# Patient Record
Sex: Female | Born: 1995 | Race: Black or African American | Hispanic: No | Marital: Single | Smoking: Never smoker
Health system: Southern US, Community
[De-identification: ages and names within clinical notes are randomized; demographics above are authoritative.]

---
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
## 2016-01-08 ENCOUNTER — Ambulatory Visit (HOSPITAL_COMMUNITY)
Admission: EM | Admit: 2016-01-08 | Discharge: 2016-01-08 | Discharge status: Absent without leave | Payer: BLUE CROSS/BLUE SHIELD

## 2019-07-04 ENCOUNTER — Other Ambulatory Visit: Payer: Self-pay

## 2019-07-04 ENCOUNTER — Encounter (HOSPITAL_COMMUNITY): Payer: Self-pay | Admitting: Emergency Medicine

## 2019-07-04 DIAGNOSIS — J4521 Mild intermittent asthma with (acute) exacerbation: Secondary | ICD-10-CM | POA: Insufficient documentation

## 2019-07-04 MED ORDER — ALBUTEROL SULFATE HFA 108 (90 BASE) MCG/ACT IN AERS
1.0000 | INHALATION_SPRAY | RESPIRATORY_TRACT | Status: DC
  Administered 2019-07-04: 2 via RESPIRATORY_TRACT
  Filled 2019-07-04: qty 6.7

## 2019-07-04 NOTE — ED Triage Notes (Signed)
Pt c/o SOB and chest tightness admit to Hx asthma and is out of her inhaler. Speaking in full sentence .

## 2019-07-05 ENCOUNTER — Emergency Department (HOSPITAL_COMMUNITY)
Admission: EM | Admit: 2019-07-05 | Discharge: 2019-07-05 | Disposition: A | Discharge status: Home / Self Care | Payer: BLUE CROSS/BLUE SHIELD | Attending: Emergency Medicine | Admitting: Emergency Medicine

## 2019-07-05 DIAGNOSIS — J4521 Mild intermittent asthma with (acute) exacerbation: Secondary | ICD-10-CM

## 2019-07-05 MED ORDER — PREDNISONE 20 MG PO TABS
60.0000 mg | ORAL_TABLET | Freq: Once | ORAL | Status: AC
  Administered 2019-07-05: 02:00:00 60 mg via ORAL
  Filled 2019-07-05: qty 3

## 2019-07-05 MED ORDER — ALBUTEROL SULFATE HFA 108 (90 BASE) MCG/ACT IN AERS: 2.0000 | INHALATION_SPRAY | Freq: Four times a day (QID) | RESPIRATORY_TRACT | 0 refills | Status: AC | PRN

## 2019-07-05 MED ORDER — PREDNISONE 20 MG PO TABS: 40.0000 mg | ORAL_TABLET | Freq: Every day | ORAL | 0 refills | Status: AC

## 2019-07-05 NOTE — ED Notes (Signed)
EDP at bedside  

## 2019-07-05 NOTE — ED Provider Notes (Signed)
[facility]-EMERGENCY DEPT Provider Note   CSN: [id number] Arrival date & time: 07/04/19  2230     History   Chief Complaint Chief Complaint  Patient presents with  . Asthma  . Shortness of Breath    HPI Kristina Frey is a 23 y.o. female.     23 year old female with a history of asthma presents to the emergency department for chest tightness and shortness of breath.  Her symptoms have been waxing and waning in severity, but gradually worsening.  She has been out of her albuterol inhaler for greater than 1 month as her primary care office is no longer open.  She has not had any provider to refill her prescription.  She was given an albuterol inhaler in triage.  Reports resolution of her chest tightness after use of this medication.  She is currently feeling asymptomatic, at her baseline.  Denies associated fevers, syncope, chest pain.  The history is provided by the patient. No language interpreter was used.  Asthma Associated symptoms include shortness of breath.  Shortness of Breath   No past medical history on file.  There are no active problems to display for this patient.   No past surgical history on file.   OB History   No obstetric history on file.      Home Medications    Prior to Admission medications   Medication Sig Start Date End Date Taking? Authorizing Provider  albuterol (VENTOLIN HFA) 108 (90 Base) MCG/ACT inhaler Inhale 2 puffs into the lungs every 6 (six) hours as needed for wheezing or shortness of breath. 07/05/19   Antony Madura, PA-C  doxycycline (VIBRAMYCIN) 100 MG capsule Take 100 mg by mouth 2 (two) times daily.    [provider]  predniSONE (DELTASONE) 20 MG tablet Take 2 tablets (40 mg total) by mouth daily. 07/05/19   Antony Madura, PA-C    Family History Family History  Problem Relation Age of Onset  . Asthma Brother     Social History Social History   Tobacco Use  . Smoking status: Never Smoker   . Smokeless tobacco: Never Used  Substance Use Topics  . Alcohol use: No  . Drug use: No     Allergies   Patient has no known allergies.   Review of Systems Review of Systems  Respiratory: Positive for shortness of breath.    Ten systems reviewed and are negative for acute change, except as noted in the HPI.    Physical Exam Updated Vital Signs BP 119/84   Pulse 77   Temp 98.1 F (36.7 C) (Oral)   Resp 16   Ht 5\' 4"  (1.626 m)   Wt 72.1 kg   SpO2 100%   BMI 27.29 kg/m   Physical Exam Vitals signs and nursing note reviewed.  Constitutional:      General: She is not in acute distress.    Appearance: She is well-developed. She is not diaphoretic.     Comments: Nontoxic appearing and in NAD  HENT:     Head: Normocephalic and atraumatic.  Eyes:     General: No scleral icterus.    Conjunctiva/sclera: Conjunctivae normal.  Neck:     Musculoskeletal: Normal range of motion.  Cardiovascular:     Rate and Rhythm: Normal rate and regular rhythm.     Pulses: Normal pulses.  Pulmonary:     Effort: Pulmonary effort is normal. No respiratory distress.     Comments: Lungs CTAB. Respirations even and unlabored. Musculoskeletal:  Normal range of motion.  Skin:    General: Skin is warm and dry.     Coloration: Skin is not pale.     Findings: No erythema or rash.  Neurological:     Mental Status: She is alert and oriented to person, place, and time.  Psychiatric:        Behavior: Behavior normal.      ED Treatments / Results  Labs (all labs ordered are listed, but only abnormal results are displayed) Labs Reviewed - No data to display  EKG EKG Interpretation  Date/Time:  Thursday July 04 2019 22:56:00 EDT Ventricular Rate:  74 PR Interval:    QRS Duration: 69 QT Interval:  341 QTC Calculation: 379 R Axis:   75 Text Interpretation: Sinus rhythm ST elev, probable normal early repol pattern Baseline wander in lead(s) II III aVF V3 V4 V5 V6 No significant  change was found Confirmed by Molpus, John [phone]) on 07/04/2019 11:00:41 PM   Radiology No results found.  Procedures Procedures (including critical care time)  Medications Ordered in ED Medications  albuterol (VENTOLIN HFA) 108 (90 Base) MCG/ACT inhaler 1-2 puff (2 puffs Inhalation Given 07/04/19 2309)  predniSONE (DELTASONE) tablet 60 mg (60 mg Oral Given 07/05/19 0207)     Initial Impression / Assessment and Plan / ED Course  I have reviewed the triage vital signs and the nursing notes.  Pertinent labs & imaging results that were available during my care of the patient were reviewed by me and considered in my medical decision making (see chart for details).        23 year old female presenting for symptoms consistent with asthma exacerbation brought on by medication noncompliance as she ran out of her inhaler more than 1 month ago.  Her vital signs are stable.  She has no hypoxia.  Her symptoms have resolved following use of an albuterol inhaler in triage.  Her lungs are clear on my assessment.  Plan for discharge on burst of prednisone.  Her albuterol inhaler prescription was refilled.  Have encouraged her to follow-up with a primary care doctor.  Return precautions discussed and provided. Patient discharged in stable condition with no unaddressed concerns.   Final Clinical Impressions(s) / ED Diagnoses   Final diagnoses:  Mild intermittent asthma with exacerbation    ED Discharge Orders         Ordered    albuterol (VENTOLIN HFA) 108 (90 Base) MCG/ACT inhaler  Every 6 hours PRN     07/05/19 0158    predniSONE (DELTASONE) 20 MG tablet  Daily     07/05/19 0158           Antonietta Breach, PA-C 07/05/19 0222    Shanon Rosser, MD 07/05/19 [phone]

## 2019-07-05 NOTE — Discharge Instructions (Signed)
Take prednisone as prescribed until finished.  Use 2 puffs of an albuterol inhaler every 4-6 hours as needed for chest tightness, wheezing, shortness of breath.  Follow-up with a primary care doctor.

## 2019-07-09 ENCOUNTER — Emergency Department (HOSPITAL_COMMUNITY): Payer: Medicaid Other

## 2019-07-09 ENCOUNTER — Other Ambulatory Visit: Payer: Self-pay

## 2019-07-09 ENCOUNTER — Emergency Department (HOSPITAL_COMMUNITY)
Admission: EM | Admit: 2019-07-09 | Discharge: 2019-07-09 | Disposition: A | Discharge status: Home / Self Care | Payer: Medicaid Other | Attending: Emergency Medicine | Admitting: Emergency Medicine

## 2019-07-09 DIAGNOSIS — M62838 Other muscle spasm: Secondary | ICD-10-CM

## 2019-07-09 DIAGNOSIS — M546 Pain in thoracic spine: Secondary | ICD-10-CM | POA: Insufficient documentation

## 2019-07-09 DIAGNOSIS — M549 Dorsalgia, unspecified: Secondary | ICD-10-CM

## 2019-07-09 DIAGNOSIS — R0789 Other chest pain: Secondary | ICD-10-CM | POA: Insufficient documentation

## 2019-07-09 DIAGNOSIS — Z79899 Other long term (current) drug therapy: Secondary | ICD-10-CM | POA: Insufficient documentation

## 2019-07-09 LAB — CBC
HCT: 38.4 % (ref 36.0–46.0)
Hemoglobin: 13.3 g/dL (ref 12.0–15.0)
MCH: 27 pg (ref 26.0–34.0)
MCHC: 34.6 g/dL (ref 30.0–36.0)
MCV: 77.9 fL — ABNORMAL LOW (ref 80.0–100.0)
Platelets: 205 10*3/uL (ref 150–400)
RBC: 4.93 MIL/uL (ref 3.87–5.11)
RDW: 13.1 % (ref 11.5–15.5)
WBC: 8.5 10*3/uL (ref 4.0–10.5)
nRBC: 0 % (ref 0.0–0.2)

## 2019-07-09 LAB — BASIC METABOLIC PANEL
Anion gap: 10 (ref 5–15)
BUN: 11 mg/dL (ref 6–20)
CO2: 22 mmol/L (ref 22–32)
Calcium: 8.6 mg/dL — ABNORMAL LOW (ref 8.9–10.3)
Chloride: 106 mmol/L (ref 98–111)
Creatinine, Ser: 0.91 mg/dL (ref 0.44–1.00)
GFR calc Af Amer: >60 mL/min (ref >60)
GFR calc non Af Amer: >60 mL/min (ref >60)
Glucose, Bld: 79 mg/dL (ref 70–99)
Potassium: 3.4 mmol/L — ABNORMAL LOW (ref 3.5–5.1)
Sodium: 138 mmol/L (ref 135–145)

## 2019-07-09 LAB — I-STAT BETA HCG BLOOD, ED (MC, WL, AP ONLY): I-stat hCG, quantitative: <5 m[IU]/mL (ref <5)

## 2019-07-09 LAB — TROPONIN I (HIGH SENSITIVITY)
Troponin I (High Sensitivity): 2 ng/L (ref <18)
Troponin I (High Sensitivity): <2 ng/L (ref <18)

## 2019-07-09 MED ORDER — LIDOCAINE 5 % EX PTCH: 1.0000 | MEDICATED_PATCH | CUTANEOUS | 0 refills | Status: AC

## 2019-07-09 MED ORDER — METHOCARBAMOL 500 MG PO TABS: 500.0000 mg | ORAL_TABLET | Freq: Two times a day (BID) | ORAL | 0 refills | Status: AC

## 2019-07-09 MED ORDER — IBUPROFEN 600 MG PO TABS: 600.0000 mg | ORAL_TABLET | Freq: Four times a day (QID) | ORAL | 0 refills | Status: AC | PRN

## 2019-07-09 MED ORDER — SODIUM CHLORIDE 0.9% FLUSH: 3.0000 mL | Freq: Once | INTRAVENOUS | Status: DC

## 2019-07-09 NOTE — ED Provider Notes (Signed)
[facility] DEPT Provider Note   CSN: [id number] Arrival date & time: 07/09/19  1451     History   Chief Complaint Chief Complaint  Patient presents with  . back pain  . Chest Pain    HPI Kristina Frey is a 23 y.o. female.     HPI   Kristina Frey is a 23 y.o. female, patient with no pertinent past medical history, presenting to the ED with pain to the left upper back, shoulder, and chest for about the past week. States it feels like a tightness and a soreness, moderate in intensity, nonradiating from these locations.  States it feels as though she is having muscle spasms intermittently.  Denies fever/chills, cough, shortness of breath, exertional chest pain, dizziness, diaphoresis, falls/trauma, numbness, weakness, or any other complaints.       No past medical history on file.  There are no active problems to display for this patient.   No past surgical history on file.   OB History   No obstetric history on file.      Home Medications    Prior to Admission medications   Medication Sig Start Date End Date Taking? Authorizing Provider  albuterol (VENTOLIN HFA) 108 (90 Base) MCG/ACT inhaler Inhale 2 puffs into the lungs every 6 (six) hours as needed for wheezing or shortness of breath. 07/05/19  Yes Antonietta Breach, PA-C  doxycycline (VIBRAMYCIN) 100 MG capsule Take 100 mg by mouth 2 (two) times daily.   Yes [provider]  predniSONE (DELTASONE) 20 MG tablet Take 2 tablets (40 mg total) by mouth daily. 07/05/19  Yes Antonietta Breach, PA-C  ibuprofen (ADVIL) 600 MG tablet Take 1 tablet (600 mg total) by mouth every 6 (six) hours as needed. 07/09/19   Keyonda Bickle C, PA-C  lidocaine (LIDODERM) 5 % Place 1 patch onto the skin daily. Remove & Discard patch within 12 hours or as directed by MD 07/09/19   Muaz Shorey C, PA-C  methocarbamol (ROBAXIN) 500 MG tablet Take 1 tablet (500 mg total) by mouth 2 (two) times daily. 07/09/19    Manolo Bosket, Helane Gunther, PA-C    Family History Family History  Problem Relation Age of Onset  . Asthma Brother     Social History Social History   Tobacco Use  . Smoking status: Never Smoker  . Smokeless tobacco: Never Used  Substance Use Topics  . Alcohol use: No  . Drug use: No     Allergies   Patient has no known allergies.   Review of Systems Review of Systems  Constitutional: Negative for chills, diaphoresis and fever.  Respiratory: Negative for cough and shortness of breath.   Cardiovascular: Negative for chest pain.  Gastrointestinal: Negative for abdominal pain, diarrhea, nausea and vomiting.  Musculoskeletal: Positive for back pain. Negative for neck pain.       Chest, trapezius, and upper back soreness  Neurological: Negative for dizziness, weakness, numbness and headaches.  All other systems reviewed and are negative.    Physical Exam Updated Vital Signs BP 123/82   Pulse 66   Temp 98.1 F (36.7 C) (Oral)   Resp 12   SpO2 100%   Physical Exam Vitals signs and nursing note reviewed.  Constitutional:      General: She is not in acute distress.    Appearance: She is well-developed. She is not diaphoretic.  HENT:     Head: Normocephalic and atraumatic.     Mouth/Throat:     Mouth:  Mucous membranes are moist.     Pharynx: Oropharynx is clear.  Eyes:     Conjunctiva/sclera: Conjunctivae normal.  Neck:     Musculoskeletal: Neck supple.     Comments: No pain with range of motion of the neck. Cardiovascular:     Rate and Rhythm: Normal rate and regular rhythm.     Pulses: Normal pulses.          Radial pulses are 2+ on the right side and 2+ on the left side.     Heart sounds: Normal heart sounds.     Comments: Tactile temperature in the extremities appropriate and equal bilaterally. Pulmonary:     Effort: Pulmonary effort is normal. No respiratory distress.     Breath sounds: Normal breath sounds.  Chest:     Chest wall: Tenderness present. No  deformity, swelling or crepitus.    Abdominal:     Palpations: Abdomen is soft.     Tenderness: There is no abdominal tenderness. There is no guarding.  Musculoskeletal:       Back:     Right lower leg: No edema.     Left lower leg: No edema.     Comments: Tenderness in the left upper back, left trapezius, and left upper chest without noted skin abnormalities, swelling, deformity, or crepitus. Full range of motion without pain or noted difficulty in the left shoulder, though she does state it "feels tight."  Normal motor function intact in all extremities. No midline spinal tenderness.   Lymphadenopathy:     Cervical: No cervical adenopathy.  Skin:    General: Skin is warm and dry.  Neurological:     Mental Status: She is alert.     Comments: Sensation grossly intact to light touch through each of the nerve distributions of the bilateral upper extremities. Abduction and adduction of the fingers intact against resistance. Grip strength equal bilaterally. Supination and pronation intact against resistance. Strength 5/5 through the cardinal directions of the bilateral wrists. Strength 5/5 with flexion and extension of the bilateral elbows. Patient can touch the thumb to each one of the fingertips without difficulty.  Patient can hold the "OK" sign against resistance.  Psychiatric:        Mood and Affect: Mood and affect normal.        Speech: Speech normal.        Behavior: Behavior normal.      ED Treatments / Results  Labs (all labs ordered are listed, but only abnormal results are displayed) Labs Reviewed  BASIC METABOLIC PANEL - Abnormal; Notable for the following components:      Result Value   Potassium 3.4 (*)    Calcium 8.6 (*)    All other components within normal limits  CBC - Abnormal; Notable for the following components:   MCV 77.9 (*)    All other components within normal limits  I-STAT BETA HCG BLOOD, ED ([facility], [facility], [facility] ONLY)  TROPONIN I (HIGH SENSITIVITY)   TROPONIN I (HIGH SENSITIVITY)    EKG None  Radiology Dg Chest 2 View  Result Date: 07/09/2019 CLINICAL DATA:  Chest pain. EXAM: CHEST - 2 VIEW COMPARISON:  11/13/2011 FINDINGS: The heart size and mediastinal contours are within normal limits. Both lungs are clear. The visualized skeletal structures are unremarkable. IMPRESSION: No active cardiopulmonary disease. Electronically Signed   By: Donzetta KohutGeoffrey  Wile M.D.   On: 07/09/2019 16:18    Procedures Procedures (including critical care time)  Medications Ordered in ED Medications  sodium chloride flush (NS) 0.9 % injection 3 mL (has no administration in time range)     Initial Impression / Assessment and Plan / ED Course  I have reviewed the triage vital signs and the nursing notes.  Pertinent labs & imaging results that were available during my care of the patient were reviewed by me and considered in my medical decision making (see chart for details).        Patient presents with pain in the upper back, trapezius, and left upper chest. Patient is nontoxic appearing, afebrile, not tachycardic, not tachypneic, not hypotensive, maintains excellent SPO2 on room air, and is in no apparent distress.  These areas of pain are reproducible on exam with only light touch.  This leads me to believe her pain is muscular in origin.  No evidence of neurovascular compromise. We will try anti-inflammatory medications and muscle relaxer.  Patient was very happy with this solution and states this plan correlates very well with what she suspects is going on with her. The patient was given specific instructions for home care as well as return precautions. Patient voices understanding of these instructions, accepts the plan, and is comfortable with discharge.   Vitals:   07/09/19 2014 07/09/19 2030 07/09/19 2100 07/09/19 2130  BP: (!) 133/92 126/89 125/80 123/82  Pulse: 80 60 71 66  Resp: 12 11 11 12   Temp:      TempSrc:      SpO2: 100% 100% 100%  100%     Final Clinical Impressions(s) / ED Diagnoses   Final diagnoses:  Upper back pain on left side  Trapezius muscle spasm    ED Discharge Orders         Ordered    lidocaine (LIDODERM) 5 %  Every 24 hours     07/09/19 2153    methocarbamol (ROBAXIN) 500 MG tablet  2 times daily     07/09/19 2153    ibuprofen (ADVIL) 600 MG tablet  Every 6 hours PRN     07/09/19 2153           2154, PA-C 07/09/19 2230    13/03/20, MD 07/10/19 2249

## 2019-07-09 NOTE — ED Triage Notes (Signed)
Pt reports that she was seen here for her asthma and chest tightness last week but states it is still happening and also having back pains.

## 2019-07-09 NOTE — Discharge Instructions (Addendum)
Take it easy, but do not lay around too much as this may make any stiffness worse.  Antiinflammatory medications: Take 600 mg of ibuprofen every 6 hours or 440 mg (over the counter dose) to 500 mg (prescription dose) of naproxen every 12 hours for the next 3 days. After this time, these medications may be used as needed for pain. Take these medications with food to avoid upset stomach. Choose only one of these medications, do not take them together. Acetaminophen (generic for Tylenol): Should you continue to have additional pain while taking the ibuprofen or naproxen, you may add in acetaminophen as needed. Your daily total maximum amount of acetaminophen from all sources should be limited to 4000mg /day for persons without liver problems, or 2000mg /day for those with liver problems. Methocarbamol: Methocarbamol (generic for Robaxin) is a muscle relaxer and can help relieve stiff muscles or muscle spasms.  Do not drive or perform other dangerous activities while taking this medication as it can cause drowsiness as well as changes in reaction time and judgement. Lidocaine patches: These are available via either prescription or over-the-counter. The over-the-counter option may be more economical one and are likely just as effective. There are multiple over-the-counter brands, such as Salonpas. Exercises: Be sure to perform the attached exercises starting with three times a week and working up to performing them daily. This is an essential part of preventing long term problems.  Follow up: Follow up with a primary care provider or orthopedic specialist for any future management of these complaints. Be sure to follow up within 7-10 days. Return: Return to the ED should symptoms worsen.  For prescription assistance, may try using prescription discount sites or apps, such as goodrx.com

## 2020-02-21 ENCOUNTER — Encounter (HOSPITAL_COMMUNITY): Payer: Self-pay

## 2020-02-21 ENCOUNTER — Other Ambulatory Visit: Payer: Self-pay

## 2020-02-21 ENCOUNTER — Emergency Department (HOSPITAL_COMMUNITY)
Admission: EM | Admit: 2020-02-21 | Discharge: 2020-02-22 | Disposition: A | Discharge status: Home / Self Care | Payer: 59 | Attending: Emergency Medicine | Admitting: Emergency Medicine

## 2020-02-21 DIAGNOSIS — T161XXA Foreign body in right ear, initial encounter: Secondary | ICD-10-CM | POA: Insufficient documentation

## 2020-02-21 DIAGNOSIS — Y999 Unspecified external cause status: Secondary | ICD-10-CM | POA: Insufficient documentation

## 2020-02-21 DIAGNOSIS — X58XXXA Exposure to other specified factors, initial encounter: Secondary | ICD-10-CM | POA: Insufficient documentation

## 2020-02-21 DIAGNOSIS — Y929 Unspecified place or not applicable: Secondary | ICD-10-CM | POA: Diagnosis not present

## 2020-02-21 DIAGNOSIS — Y939 Activity, unspecified: Secondary | ICD-10-CM | POA: Insufficient documentation

## 2020-02-21 MED ORDER — LIDOCAINE VISCOUS HCL 2 % MT SOLN
15.0000 mL | Freq: Once | OROMUCOSAL | Status: AC
  Administered 2020-02-22: 15 mL via OROMUCOSAL
  Filled 2020-02-21: qty 15

## 2020-02-21 NOTE — ED Triage Notes (Signed)
Moth visualized in pt's R ear.

## 2020-02-22 DIAGNOSIS — T161XXA Foreign body in right ear, initial encounter: Secondary | ICD-10-CM | POA: Diagnosis not present

## 2020-02-22 MED ORDER — CIPROFLOXACIN-DEXAMETHASONE 0.3-0.1 % OT SUSP: 4.0000 [drp] | Freq: Two times a day (BID) | OTIC | 0 refills | Status: AC

## 2020-02-22 NOTE — Discharge Instructions (Signed)
Thank you for allowing me to care for you today in the Emergency Department.   You were seen today for an insect in the right ear, which was removed in the ER.  There was a minimal amount of bleeding noted in the right ear canal, but your eardrum looked great.  Typically, the ear canal should heal on its own without any difficulty over the next few days.  I would recommend avoiding getting water in the ear, swimming, or diving for the next week.  However, if you start to have increasing pain in the right ear canal, particularly in the next 5 days, changes in her hearing, drainage from the ear, you can fill the prescription of eardrops and place 4 drops in the right ear 2 times daily for 7 days.  If you have no complaints from your right ear and it heals up just fine, you do not need to take these drops or get the prescription filled.  Although I would not anticipate this happening, you should return to the emergency department if you start having fevers, persistent dizziness, vomiting, if the outside of the ear becomes very red and swollen, or if you develop other new, concerning symptoms.

## 2020-02-22 NOTE — ED Provider Notes (Signed)
[facility]-EMERGENCY DEPT Provider Note   CSN: [id number] Arrival date & time: 02/21/20  2235     History Chief Complaint  Patient presents with   Foreign Body    Kristina Frey is a 24 y.o. female.  The history is provided by the patient. No language interpreter was used.  Foreign Body Location:  R eye Suspected object:  Insect Pain quality:  Unable to specify Pain severity:  Mild Duration: tonight. Timing:  Constant Progression:  Unchanged Chronicity:  New Worsened by:  Nothing Ineffective treatments:  None tried Associated symptoms: ear discharge and ear pain   Associated symptoms: no abdominal pain, no hearing loss, no nasal discharge, no rhinorrhea and no vomiting   Ear pain:    Location:  Right   Severity:  Mild   Onset quality:  Sudden   Duration: tonight.   Timing:  Constant   Progression:  Unchanged   Chronicity:  New Risk factors: no prior similar events        History reviewed. No pertinent past medical history.  There are no problems to display for this patient.   History reviewed. No pertinent surgical history.   OB History   No obstetric history on file.     Family History  Problem Relation Age of Onset   Asthma Brother     Social History   Tobacco Use   Smoking status: Never Smoker   Smokeless tobacco: Never Used  Substance Use Topics   Alcohol use: No   Drug use: No    Home Medications Prior to Admission medications   Medication Sig Start Date End Date Taking? Authorizing Provider  albuterol (VENTOLIN HFA) 108 (90 Base) MCG/ACT inhaler Inhale 2 puffs into the lungs every 6 (six) hours as needed for wheezing or shortness of breath. 07/05/19   Antony Madura, PA-C  ciprofloxacin-dexamethasone (CIPRODEX) OTIC suspension Place 4 drops into the right ear 2 (two) times daily for 7 days. 02/22/20 02/29/20  Aneesh Faller A, PA-C  doxycycline (VIBRAMYCIN) 100 MG capsule Take 100 mg by mouth 2 (two) times daily.     [provider]  ibuprofen (ADVIL) 600 MG tablet Take 1 tablet (600 mg total) by mouth every 6 (six) hours as needed. 07/09/19   Joy, Shawn C, PA-C  lidocaine (LIDODERM) 5 % Place 1 patch onto the skin daily. Remove & Discard patch within 12 hours or as directed by MD 07/09/19   Joy, Shawn C, PA-C  methocarbamol (ROBAXIN) 500 MG tablet Take 1 tablet (500 mg total) by mouth 2 (two) times daily. 07/09/19   Joy, Shawn C, PA-C  predniSONE (DELTASONE) 20 MG tablet Take 2 tablets (40 mg total) by mouth daily. 07/05/19   Antony Madura, PA-C    Allergies    Patient has no known allergies.  Review of Systems   Review of Systems  Constitutional: Negative for activity change, chills and fever.  HENT: Positive for ear discharge and ear pain. Negative for hearing loss, rhinorrhea and tinnitus.   Respiratory: Negative for shortness of breath.   Cardiovascular: Negative for chest pain.  Gastrointestinal: Negative for abdominal pain and vomiting.  Musculoskeletal: Negative for back pain.  Skin: Negative for color change, rash and wound.  Neurological: Negative for dizziness, light-headedness and headaches.    Physical Exam Updated Vital Signs BP 117/84    Pulse (!) 110    Temp 98.3 F (36.8 C)    Resp 16    SpO2 99%   Physical Exam Vitals  and nursing note reviewed.  Constitutional:      General: She is not in acute distress. HENT:     Head: Normocephalic.     Left Ear: Tympanic membrane, ear canal and external ear normal.     Ears:     Comments: Insect noted in the right ear canal.  There is bleeding noted to the right ear canal that is hemostatic.  TM is intact.  No mastoid tenderness.  Normal exam of the left ear. Eyes:     Conjunctiva/sclera: Conjunctivae normal.  Cardiovascular:     Rate and Rhythm: Normal rate and regular rhythm.     Heart sounds: No murmur heard.  No friction rub. No gallop.   Pulmonary:     Effort: Pulmonary effort is normal. No respiratory distress.    Abdominal:     General: There is no distension.     Palpations: Abdomen is soft.  Musculoskeletal:     Cervical back: Neck supple.  Skin:    General: Skin is warm.     Findings: No rash.  Neurological:     Mental Status: She is alert.  Psychiatric:        Behavior: Behavior normal.     ED Results / Procedures / Treatments   Labs (all labs ordered are listed, but only abnormal results are displayed) Labs Reviewed - No data to display  EKG None  Radiology No results found.  Procedures .Foreign Body Removal  Date/Time: 02/22/2020 1:18 AM Performed by: Joanne Gavel, PA-C Authorized by: Joanne Gavel, PA-C  Body area: ear Location details: right ear  Anesthesia: Local Anesthetic: topical anesthetic (Viscous lidocaine) Anesthetic total: 15 mL  Sedation: Patient sedated: no  Patient cooperative: yes Localization method: visualized Removal mechanism: irrigation Complexity: simple 1 objects recovered. Objects recovered: insect Post-procedure assessment: foreign body removed Patient tolerance: patient tolerated the procedure well with no immediate complications   (including critical care time)  Medications Ordered in ED Medications  lidocaine (XYLOCAINE) 2 % viscous mouth solution 15 mL (15 mLs Mouth/Throat Given 02/22/20 0022)    ED Course  I have reviewed the triage vital signs and the nursing notes.  Pertinent labs & imaging results that were available during my care of the patient were reviewed by me and considered in my medical decision making (see chart for details).    MDM Rules/Calculators/A&P                          24 year old female who is otherwise healthy presenting with foreign body to the right ear earlier tonight.  Viscous lidocaine was inserted into the right canal.  Insect is visualized in the right canal.  TM is unremarkable.  Attempted removal with suction unsuccessfully.  10 cc of saline was irrigated into the right ear with removal  of the insect fully intact.  On reevaluation, there is a small amount of bleeding that is hemostatic noted to the right canal.  I have a low suspicion that the patient will develop secondary infection, but she was given a restrictions on water in the right ear for the next week and we will also discharge her with a course of Ciprodex if she does develop worsening pain or signs or symptoms of infection over the next week.  ER return precautions given.  She is hemodynamically stable no acute distress.  Safe for discharge home with outpatient follow-up as indicated.  Final Clinical Impression(s) / ED Diagnoses Final diagnoses:  Foreign body of right ear, initial encounter    Rx / DC Orders ED Discharge Orders         Ordered    ciprofloxacin-dexamethasone (CIPRODEX) OTIC suspension  2 times daily     Discontinue  Reprint     02/22/20 0111           Lesean Woolverton A, PA-C 02/22/20 0121    Sabas Sous, MD 02/22/20 [phone]

## 2020-06-13 DIAGNOSIS — Z20822 Contact with and (suspected) exposure to covid-19: Secondary | ICD-10-CM | POA: Diagnosis not present

## 2020-09-30 DIAGNOSIS — Z20822 Contact with and (suspected) exposure to covid-19: Secondary | ICD-10-CM | POA: Diagnosis not present

## 2021-03-05 DIAGNOSIS — Z23 Encounter for immunization: Secondary | ICD-10-CM | POA: Diagnosis not present

## 2021-06-23 IMAGING — CR DG CHEST 2V
2 series · 2 of 2 positions shown · non-contrast
Comparison: 11/13/2011

CLINICAL DATA: Chest pain.

EXAM:
CHEST - 2 VIEW

[w chest pa]
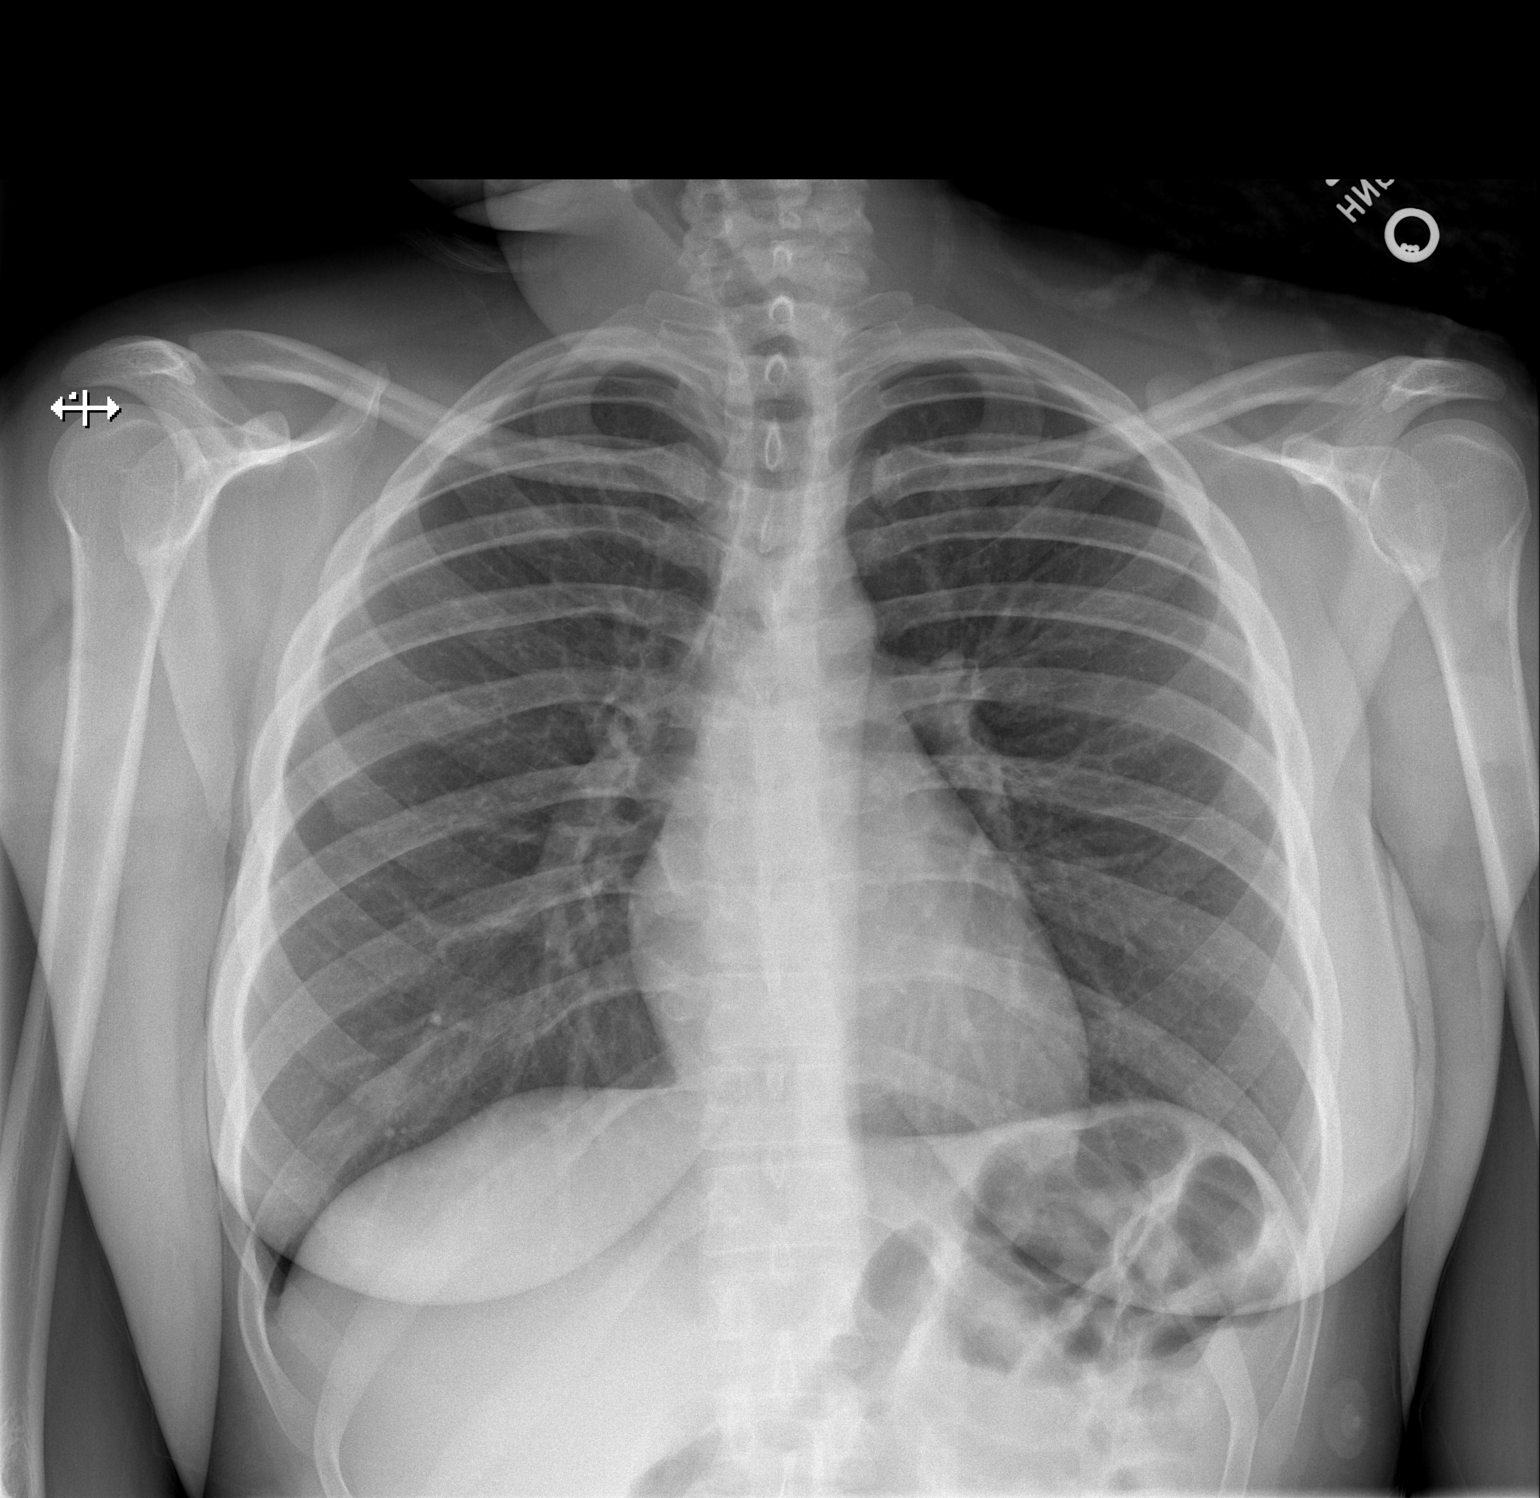

[w chest lat]
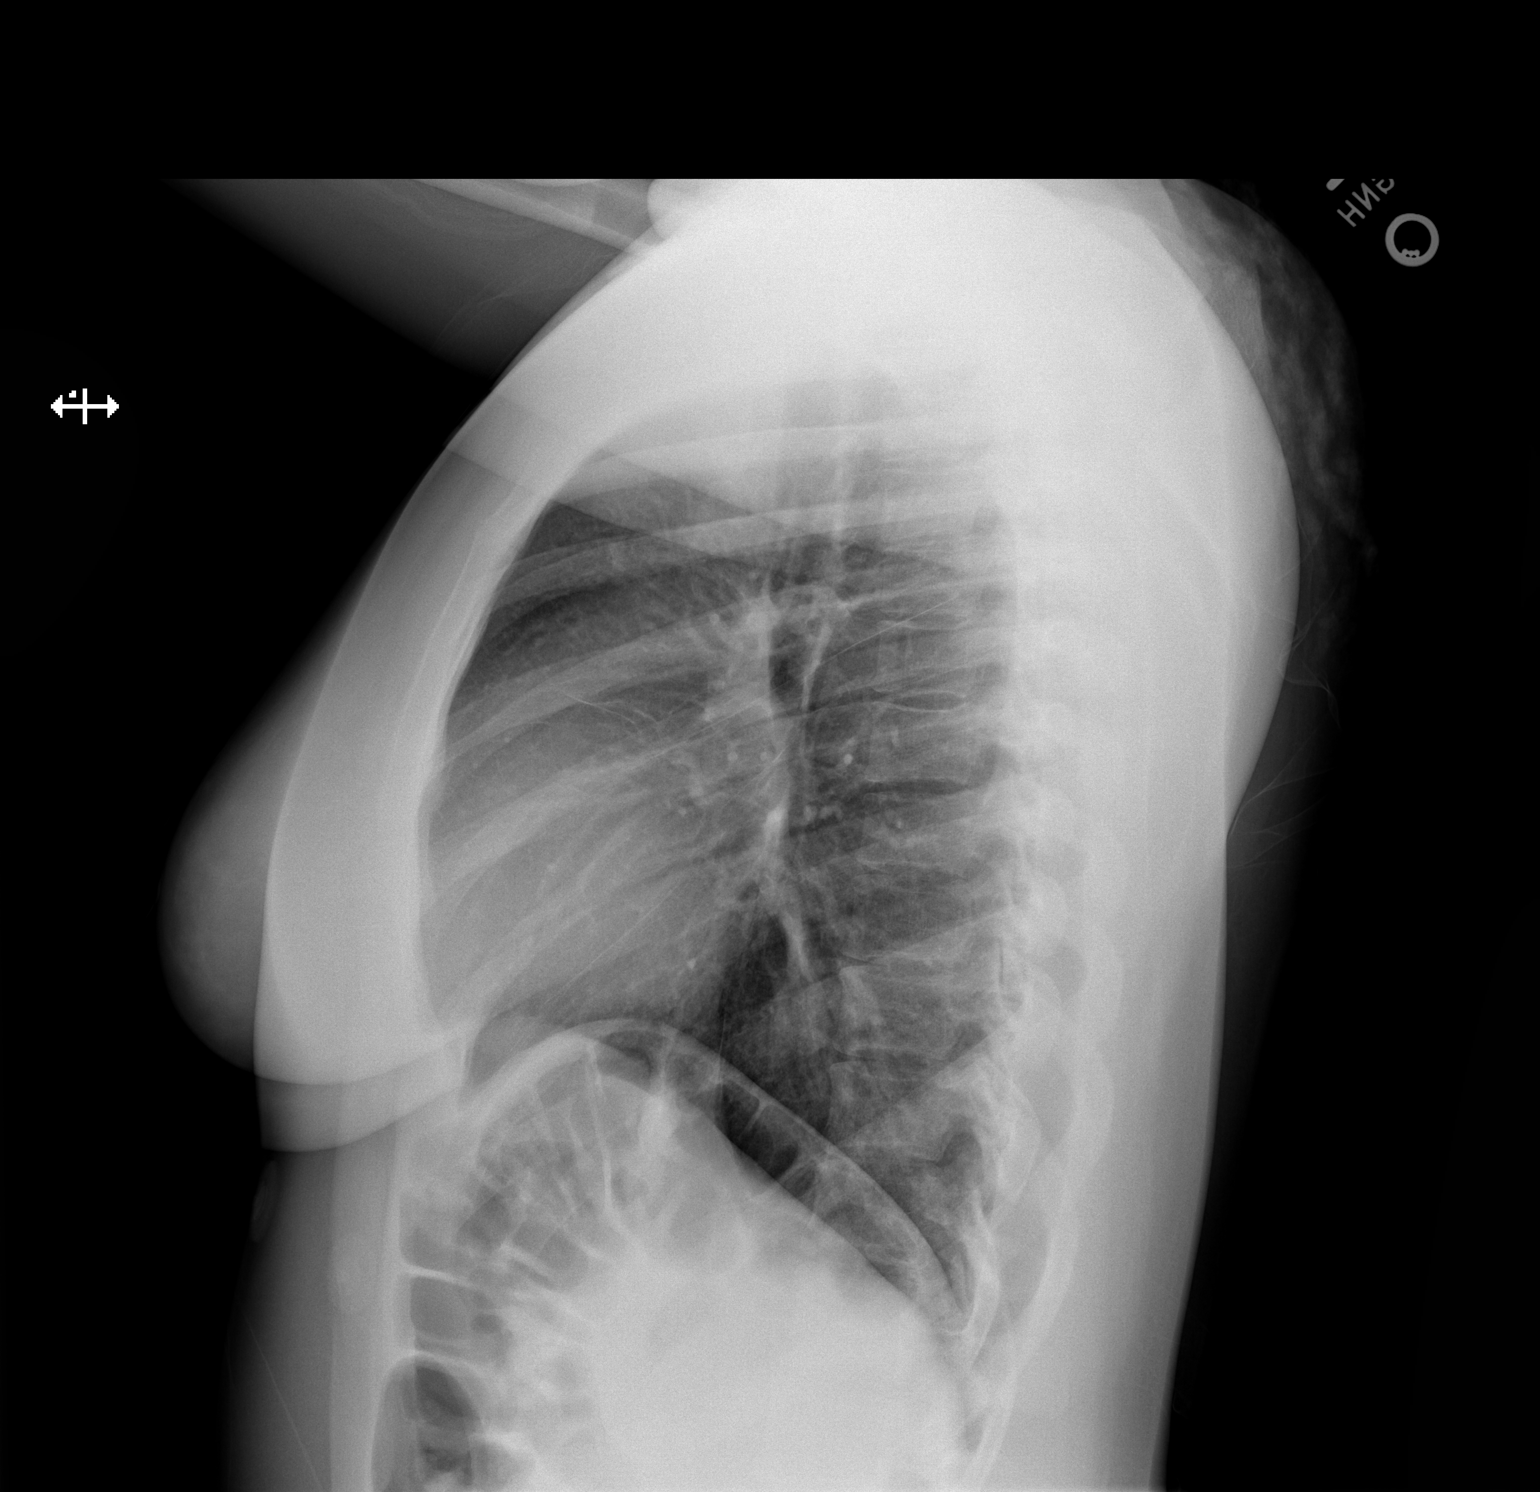

[2 of 2 positions shown; findings below may reference images not displayed]

FINDINGS: The heart size and mediastinal contours are within normal limits.
Both lungs are clear. The visualized skeletal structures are
unremarkable.
IMPRESSION: No active cardiopulmonary disease.
